# Patient Record
Sex: Female | Born: 1955 | Race: Black or African American | Hispanic: No | Marital: Single | Smoking: Never smoker
Health system: Southern US, Community
[De-identification: ages and names within clinical notes are randomized; demographics above are authoritative.]

## PROBLEM LIST (undated history)

## (undated) DIAGNOSIS — E079 Disorder of thyroid, unspecified: Secondary | ICD-10-CM

---

## 2014-12-24 DIAGNOSIS — E039 Hypothyroidism, unspecified: Secondary | ICD-10-CM | POA: Insufficient documentation

## 2015-11-15 DIAGNOSIS — Z832 Family history of diseases of the blood and blood-forming organs and certain disorders involving the immune mechanism: Secondary | ICD-10-CM | POA: Insufficient documentation

## 2016-10-16 DIAGNOSIS — M79644 Pain in right finger(s): Secondary | ICD-10-CM | POA: Insufficient documentation

## 2017-06-04 DIAGNOSIS — M1711 Unilateral primary osteoarthritis, right knee: Secondary | ICD-10-CM | POA: Insufficient documentation

## 2018-06-19 ENCOUNTER — Other Ambulatory Visit: Payer: Self-pay | Admitting: Family Medicine

## 2018-06-19 DIAGNOSIS — E0590 Thyrotoxicosis, unspecified without thyrotoxic crisis or storm: Secondary | ICD-10-CM

## 2018-06-19 DIAGNOSIS — E0789 Other specified disorders of thyroid: Secondary | ICD-10-CM

## 2018-06-24 ENCOUNTER — Ambulatory Visit
Admission: RE | Admit: 2018-06-24 | Discharge: 2018-06-24 | Disposition: A | Discharge status: Home / Self Care | Payer: BC Managed Care – PPO | Source: Ambulatory Visit | Attending: Family Medicine | Admitting: Family Medicine

## 2018-06-24 DIAGNOSIS — E0590 Thyrotoxicosis, unspecified without thyrotoxic crisis or storm: Secondary | ICD-10-CM | POA: Diagnosis not present

## 2018-06-24 DIAGNOSIS — E042 Nontoxic multinodular goiter: Secondary | ICD-10-CM | POA: Diagnosis not present

## 2018-06-24 DIAGNOSIS — E0789 Other specified disorders of thyroid: Secondary | ICD-10-CM | POA: Diagnosis not present

## 2019-04-01 ENCOUNTER — Other Ambulatory Visit: Payer: Self-pay | Admitting: Otolaryngology

## 2019-04-01 DIAGNOSIS — E041 Nontoxic single thyroid nodule: Secondary | ICD-10-CM

## 2019-04-03 ENCOUNTER — Other Ambulatory Visit: Payer: Self-pay | Admitting: Unknown Physician Specialty

## 2019-04-03 DIAGNOSIS — R1310 Dysphagia, unspecified: Secondary | ICD-10-CM

## 2019-04-11 ENCOUNTER — Ambulatory Visit
Admission: RE | Admit: 2019-04-11 | Discharge: 2019-04-11 | Disposition: A | Discharge status: Home / Self Care | Payer: BC Managed Care – PPO | Source: Ambulatory Visit | Attending: Otolaryngology | Admitting: Otolaryngology

## 2019-04-11 ENCOUNTER — Other Ambulatory Visit: Payer: Self-pay

## 2019-04-11 DIAGNOSIS — E041 Nontoxic single thyroid nodule: Secondary | ICD-10-CM

## 2019-04-18 ENCOUNTER — Other Ambulatory Visit: Payer: Self-pay | Admitting: Obstetrics & Gynecology

## 2019-04-18 DIAGNOSIS — Z1231 Encounter for screening mammogram for malignant neoplasm of breast: Secondary | ICD-10-CM

## 2019-05-07 ENCOUNTER — Ambulatory Visit
Admission: RE | Admit: 2019-05-07 | Discharge: 2019-05-07 | Disposition: A | Discharge status: Home / Self Care | Payer: BC Managed Care – PPO | Source: Ambulatory Visit | Attending: Obstetrics & Gynecology | Admitting: Obstetrics & Gynecology

## 2019-05-07 ENCOUNTER — Encounter: Payer: Self-pay | Admitting: Radiology

## 2019-05-07 DIAGNOSIS — Z1231 Encounter for screening mammogram for malignant neoplasm of breast: Secondary | ICD-10-CM | POA: Insufficient documentation

## 2019-05-08 ENCOUNTER — Ambulatory Visit: Payer: BC Managed Care – PPO

## 2019-05-31 ENCOUNTER — Ambulatory Visit
Admission: EM | Admit: 2019-05-31 | Discharge: 2019-05-31 | Disposition: A | Discharge status: Home / Self Care | Payer: BC Managed Care – PPO | Attending: Emergency Medicine | Admitting: Emergency Medicine

## 2019-05-31 ENCOUNTER — Encounter: Payer: Self-pay | Admitting: Emergency Medicine

## 2019-05-31 ENCOUNTER — Other Ambulatory Visit: Payer: Self-pay

## 2019-05-31 ENCOUNTER — Ambulatory Visit (INDEPENDENT_AMBULATORY_CARE_PROVIDER_SITE_OTHER): Payer: BC Managed Care – PPO

## 2019-05-31 DIAGNOSIS — M79674 Pain in right toe(s): Secondary | ICD-10-CM

## 2019-05-31 DIAGNOSIS — S92491A Other fracture of right great toe, initial encounter for closed fracture: Secondary | ICD-10-CM | POA: Diagnosis not present

## 2019-05-31 DIAGNOSIS — W01198A Fall on same level from slipping, tripping and stumbling with subsequent striking against other object, initial encounter: Secondary | ICD-10-CM | POA: Diagnosis not present

## 2019-05-31 HISTORY — DX: Disorder of thyroid, unspecified: E07.9

## 2019-05-31 NOTE — ED Provider Notes (Signed)
[facility] ____________________________________________  Time seen: Approximately 3:22 PM  I have reviewed the triage vital signs and the nursing notes.   HISTORY  Chief Complaint Toe Pain (right big toe)   HPI Carrie Tran is a 63 y.o. female presenting for evaluation of right great toe pain.  Patient reports 1 week ago she was walking through her house and tripped over her rug causing her to hit her toe.  Denies fall or other injury.  States she was doing much better after a few days of buddy taping her toe, but reports a few nights ago she rolled over in her sleep and slept on her stomach with her toe against the bed causing it to become aggravated again.  States pain is mild.  States buddy taping has helped.  Denies pain radiation, paresthesias or other injuries.  Reports otherwise doing well.  Past Medical History:  Diagnosis Date   Thyroid disease     There are no active problems to display for this patient.   History reviewed. No pertinent surgical history.   No current facility-administered medications for this encounter.   Current Outpatient Medications:    calcium carbonate (OS-CAL) 600 MG TABS tablet, Take by mouth., Disp: , Rfl:    Cholecalciferol (VITAMIN D3) 125 MCG (5000 UT) TABS, Take by mouth., Disp: , Rfl:    levothyroxine (SYNTHROID) 50 MCG tablet, Take by mouth., Disp: , Rfl:    Multiple Vitamin (MULTI-VITAMIN) tablet, Take by mouth., Disp: , Rfl:   Allergies Banana, Oxycodone-acetaminophen, and Latex  Family History  Problem Relation Age of Onset   Diabetes Mother     Social History Social History   Tobacco Use   Smoking status: Never Smoker   Smokeless tobacco: Never Used  Substance Use Topics   Alcohol use: Not Currently   Drug use: Never    Review of Systems Constitutional: No fever ENT: No sore throat. Cardiovascular: Denies chest pain. Respiratory: Denies shortness of breath. Musculoskeletal: Right great  toe pain.  ____________________________________________   PHYSICAL EXAM:  VITAL SIGNS: ED Triage Vitals  Enc Vitals Group     BP 05/31/19 1456 124/67     Pulse Rate 05/31/19 1456 69     Resp 05/31/19 1456 16     Temp 05/31/19 1456 97.8 F (36.6 C)     Temp Source 05/31/19 1456 Oral     SpO2 05/31/19 1456 100 %     Weight 05/31/19 1452 169 lb (76.7 kg)     Height 05/31/19 1452 5\' 4"  (1.626 m)     Head Circumference --      Peak Flow --      Pain Score 05/31/19 1451 2     Pain Loc --      Pain Edu? --      Excl. in GC? --     Constitutional: Alert and oriented. Well appearing and in no acute distress.06/02/19 ENT      Head: Normocephalic and atraumatic. Cardiovascular: Good peripheral circulation. Respiratory: Normal respiratory effort without tachypnea nor retractions. Musculoskeletal:  Bilateral pedal pulses equal and easily palpated.  Steady gait. Except: Right great toe proximal distal phalanx and proximal phalanx tenderness to direct palpation with minimal swelling and ecchymosis, able to still flex and extend, normal distal sensation, right foot otherwise nontender. Neurologic:  Normal speech and language.  Skin:  Skin is warm, dry and intact. No rash noted. Psychiatric: Mood and affect are normal. Speech and behavior are normal. Patient exhibits appropriate insight and judgment  ___________________________________________   LABS (all labs ordered are listed, but only abnormal results are displayed)  Labs Reviewed - No data to display  RADIOLOGY  Dg Toe Great Right  Result Date: 05/31/2019 CLINICAL DATA:  Pain after fall EXAM: RIGHT FIRST TOE: 3 V COMPARISON:  None. FINDINGS: Frontal, oblique, and lateral views were obtained. There is a fracture along the dorsal aspect of the proximal portion of the first distal phalanx. Fracture fragment is present in the dorsal aspect of the first IP joint. No other fracture is evident. No dislocation. There is no appreciable joint  space narrowing or erosion. IMPRESSION: Fracture along the dorsal aspect of the proximal portion of the first distal phalanx. No other fracture. No dislocation. No appreciable arthropathy. These results will be called to the ordering clinician or representative by the Radiologist Assistant, and communication documented in the PACS or zVision Dashboard. Electronically Signed   By: Lowella Grip III M.D.   On: 05/31/2019 15:15   ____________________________________________   PROCEDURES Procedures    INITIAL IMPRESSION / ASSESSMENT AND PLAN / ED COURSE  Pertinent labs & imaging results that were available during my care of the patient were reviewed by me and considered in my medical decision making (see chart for details).  Well-appearing patient.  No acute distress.  Right great toe pain post mechanical injury.  Right great toe x-ray as above, fracture along dorsal aspect of the proximal portion distal phalanx.  Buddy taping to first and second toe applied and postoperative shoe sent.  Continue supportive care, follow-up with podiatry.  Discussed follow up and return parameters including no resolution or any worsening concerns. Patient verbalized understanding and agreed to plan.   ____________________________________________   FINAL CLINICAL IMPRESSION(S) / ED DIAGNOSES  Final diagnoses:  Other fracture of right great toe, initial encounter for closed fracture     ED Discharge Orders    None       Note: This dictation was prepared with Dragon dictation along with smaller phrase technology. Any transcriptional errors that result from this process are unintentional.         Marylene Land, NP 05/31/19 1754

## 2019-05-31 NOTE — ED Triage Notes (Signed)
Patient states that a week ago that she tripped over a rock and injured her right big toe.  Patient c/o ongoing pain in her right big toe.

## 2019-05-31 NOTE — Discharge Instructions (Addendum)
Ice. Buddy tape. Use post op shoe.   Follow-up with podiatry in 1 week.  Follow up with your primary care physician this week as needed. Return to Urgent care for new or worsening concerns.

## 2019-06-03 ENCOUNTER — Other Ambulatory Visit: Payer: Self-pay

## 2019-06-03 ENCOUNTER — Ambulatory Visit
Admission: RE | Admit: 2019-06-03 | Discharge: 2019-06-03 | Disposition: A | Discharge status: Home / Self Care | Payer: BC Managed Care – PPO | Source: Ambulatory Visit | Attending: Unknown Physician Specialty | Admitting: Unknown Physician Specialty

## 2019-06-03 DIAGNOSIS — R1310 Dysphagia, unspecified: Secondary | ICD-10-CM | POA: Insufficient documentation

## 2019-06-03 NOTE — Therapy (Signed)
[facility] DI[facility]NOSTIC RADIOLOGY [address] [place], [place], [address] Phone: [phone]   Fax:     Modified Barium Swallow  Patient Details  Name: Carrie Tran MRN: [id number] Date of Birth: 22-May-1956 No data recorded  Encounter Date: 06/03/2019  End of Session - 06/03/19 1411    Visit Number  1    Number of Visits  1    Date for SLP Re-Evaluation  06/03/19    SLP Start Time  1300    SLP Stop Time   1345    SLP Time Calculation (min)  45 min    Activity Tolerance  Patient tolerated treatment well       Past Medical History:  Diagnosis Date  . Thyroid disease     No past surgical history on file.  There were no vitals filed for this visit.    Subjective: Patient behavior: (alertness, ability to follow instructions, etc.): The patient is alert, able to verbalize her swallowing complaints, and follow directions.  Chief complaint: Patient reports food and pills sticking in her throat   Objective:  Radiological Procedure: A videoflouroscopic evaluation of oral-preparatory, reflex initiation, and pharyngeal phases of the swallow was performed; as well as a screening of the upper esophageal phase.  I. POSTURE: Upright in MBS chair, standing  II. VIEW: Lateral and A-P  III. COMPENSATORY STRATEGIES: Liquid wash aids esophageal clearance  IV. BOLUSES ADMINISTERED:   Thin Liquid: 3   Nectar-thick Liquid: 2   Honey-thick Liquid: DNT   Puree: 3   Mechanical Soft: 1/4 graham cracker in applesauce   Barium tablet  V. RESULTS OF EVALUATION: A. ORAL PREPARATORY PHASE: (The lips, tongue, and velum are observed for strength and coordination)       **Overall Severity Rating: within normal limits  B. SWALLOW INITIATION/REFLEX: (The reflex is normal if "triggered" by the time the bolus reached the base of the tongue)  **Overall Severity Rating: within normal limits   C. PHARYNGEAL PHASE: (Pharyngeal function is normal if  the bolus shows rapid, smooth, and continuous transit through the pharynx and there is no pharyngeal residue after the swallow)  **Overall Severity Rating: within normal limits   D. LARYNGEAL PENETRATION: (Material entering into the laryngeal inlet/vestibule but not aspirated) None  E. ASPIRATION: None  F. ESOPHAGEAL PHASE: (Screening of the upper esophagus) barium tablet was observed to be lodged at the mid-esophagus.  A bolus of nectar-thick liquid moved the pill on through to the stomach.  There was mid- and distal-esophagus stasis with solid bolus.    ASSESSMENT: This 63 year old woman; with complaint of pills/food sticking in her throat and choking episodes; is presenting with functional oropharyngeal swallowing.  Oral control of the bolus including oral hold, rotary mastication, and anterior to posterior transfer is within functional limits.   Timing of pharyngeal swallow initiation is within normal limits.  Aspects of the pharyngeal stage of swallowing including tongue base retraction, hyolaryngeal excursion, epiglottic inversion, and duration/amplitude of UES opening are within normal limits.  There is no observed pharyngeal residue, laryngeal penetration, or tracheal aspiration.  The patient is not at risk for prandial aspiration.  The patient's symptoms were reproduced with a barium tablet.  Although it flowed quickly through the oropharynx and into the esophagus, the patient reported the sensation of the pill in her throat.  On standing for the A-P view, the barium tablet was observed to be lodged at the mid-esophagus.  A bolus of nectar-thick liquid moved the pill on  through to the stomach.  There was mid- and distal-esophagus stasis with solid bolus.  The patient observed the tablet flow into the esophagus despite her sensation of it sticking in her throat.  She was reassured that when she feels choked, the food/pill is most probably lodged lower in the esophagus.  She was advised to alternate  liquids and solids when eating and to take extra water with medication to promote esophageal clearance.  PLAN/RECOMMENDATIONS:   A. Diet: Regular   B. Swallowing Precautions: Alternate liquids and solids, take plenty of fluids with medication   C. Recommended consultation to: follow up with MDs as recommended, consider GI consult   D. Therapy recommendations: speech therapy is not indicated   E. Results and recommendations were discussed with the patient immediately following the study and the final report routed to the referring MD.   Dysphagia, unspecified type - Plan: DG SWALLOW FUNC SPEECH PATH, DG SWALLOW FUNC SPEECH PATH        Problem List There are no active problems to display for this patient.  Leroy Sea, MS/CCC- SLP  Lou Miner 06/03/2019, Mickie Kay PM  [facility] [place], [place], [address] Phone: [phone]   Fax:     Name: Carrie Tran MRN: [id number] Date of Birth: 1955/10/31

## 2019-07-21 ENCOUNTER — Other Ambulatory Visit: Payer: Self-pay

## 2019-07-21 ENCOUNTER — Ambulatory Visit (INDEPENDENT_AMBULATORY_CARE_PROVIDER_SITE_OTHER): Payer: BC Managed Care – PPO | Admitting: Gastroenterology

## 2019-07-21 ENCOUNTER — Encounter: Payer: Self-pay | Admitting: Gastroenterology

## 2019-07-21 VITALS — BP 117/74 | HR 70 | Temp 96.1°F | Ht 64.0 in | Wt 171.6 lb

## 2019-07-21 DIAGNOSIS — R1319 Other dysphagia: Secondary | ICD-10-CM

## 2019-07-21 DIAGNOSIS — R1310 Dysphagia, unspecified: Secondary | ICD-10-CM

## 2019-07-21 NOTE — Progress Notes (Signed)
Gastroenterology Consultation  Referring Provider:     Carloyn Manner, MD Primary Care Physician:  Patient, No Pcp Per Primary Gastroenterologist:  Dr. Allen Norris     Reason for Consultation:     Dysphagia        HPI:   Carrie Tran is a 64 y.o. y/o female referred for consultation & management of dysphagia by Dr. Patient, No Pcp Per.  This patient comes today with a history of dysphagia since she was a young child.  She states it can happen with any types of food.  The patient was seen by ENT and ordered a modified barium swallow.  The patient then reports that she felt like pills she had taken was stuck in her esophagus.  She then had fluoroscopy taken of her chest that showed the pill to be stuck there which promptly cleared after taking it on the drink.  The patient's overall recommendation was a GI consult after there was a mid to distal esophageal stasis with solid bolus.  The patient denies any black stools or bloody stools.  She also denies any fevers chills nausea vomiting or change in bowel habits.  The patient reports that her last colonoscopy was 10 years ago.  Past Medical History:  Diagnosis Date  . Thyroid disease     History reviewed. No pertinent surgical history.  Prior to Admission medications   Medication Sig Start Date End Date Taking? Authorizing Provider  calcium carbonate (OS-CAL) 600 MG TABS tablet Take by mouth.   Yes [provider]  Cholecalciferol (VITAMIN D3) 125 MCG (5000 UT) TABS Take by mouth.   Yes [provider]  levothyroxine (SYNTHROID) 50 MCG tablet Take by mouth. 02/08/18  Yes [provider]  Multiple Vitamin (MULTI-VITAMIN) tablet Take by mouth.   Yes [provider]    Family History  Problem Relation Age of Onset  . Diabetes Mother      Social History   Tobacco Use  . Smoking status: Never Smoker  . Smokeless tobacco: Never Used  Substance Use Topics  . Alcohol use: Not Currently  . Drug use:  Never    Allergies as of 07/21/2019 - Review Complete 07/21/2019  Allergen Reaction Noted  . Banana Other (See Comments) and Swelling 10/25/2013  . Oxycodone-acetaminophen Other (See Comments) 05/19/2011  . Latex Rash 05/19/2011    Review of Systems:    All systems reviewed and negative except where noted in HPI.   Physical Exam:  BP 117/74   Pulse 70   Temp (!) 96.1 F (35.6 C) (Temporal)   Ht 5\' 4"  (1.626 m)   Wt 171 lb 9.6 oz (77.8 kg)   BMI 29.46 kg/m  No LMP recorded. Patient is postmenopausal. General:   Alert,  Well-developed, well-nourished, pleasant and cooperative in NAD Head:  Normocephalic and atraumatic. Eyes:  Sclera clear, no icterus.   Conjunctiva pink. Ears:  Normal auditory acuity. Neck:  Supple; no masses or thyromegaly. Lungs:  Respirations even and unlabored.  Clear throughout to auscultation.   No wheezes, crackles, or rhonchi. No acute distress. Heart:  Regular rate and rhythm; no murmurs, clicks, rubs, or gallops. Abdomen:  Normal bowel sounds.  No bruits.  Soft, non-tender and non-distended without masses, hepatosplenomegaly or hernias noted.  No guarding or rebound tenderness.  Negative Carnett sign.   Rectal:  Deferred.  Pulses:  Normal pulses noted. Extremities:  No clubbing or edema.  No cyanosis. Neurologic:  Alert and oriented x3;  grossly normal neurologically.  Skin:  Intact without significant lesions or rashes.  No jaundice. Lymph Nodes:  No significant cervical adenopathy. Psych:  Alert and cooperative. Normal mood and affect.  Imaging Studies: No results found.  Assessment and Plan:   Carrie Tran is a 64 y.o. y/o female who comes in today with a history of longstanding dysphagia since she was a child.  She states it can happen with any food and is not always with solids.  The patient had a modified barium swallow with a barium pill that appears to have hung up in the mid to distal esophagus.  The patient will be set up for an EGD for  her dysphagia and a colonoscopy because she is due for her screening.  The patient has been explained the plan and agrees with it.    [name], MD. Clementeen Graham    Note: This dictation was prepared with Dragon dictation along with smaller phrase technology. Any transcriptional errors that result from this process are unintentional.

## 2020-10-21 ENCOUNTER — Other Ambulatory Visit: Payer: Self-pay

## 2020-10-21 ENCOUNTER — Ambulatory Visit
Admission: EM | Admit: 2020-10-21 | Discharge: 2020-10-21 | Disposition: A | Discharge status: Home / Self Care | Payer: BC Managed Care – PPO | Attending: Physician Assistant | Admitting: Physician Assistant

## 2020-10-21 ENCOUNTER — Encounter: Payer: Self-pay | Admitting: Emergency Medicine

## 2020-10-21 DIAGNOSIS — R300 Dysuria: Secondary | ICD-10-CM | POA: Diagnosis not present

## 2020-10-21 LAB — URINALYSIS, COMPLETE (UACMP) WITH MICROSCOPIC
Bilirubin Urine: NEGATIVE
Glucose, UA: NEGATIVE mg/dL
Hgb urine dipstick: NEGATIVE
Ketones, ur: NEGATIVE mg/dL
Leukocytes,Ua: NEGATIVE
Nitrite: NEGATIVE
Protein, ur: NEGATIVE mg/dL
Specific Gravity, Urine: <1.005 — ABNORMAL LOW (ref 1.005–1.030)
pH: 5.5 (ref 5.0–8.0)

## 2020-10-21 MED ORDER — NITROFURANTOIN MONOHYD MACRO 100 MG PO CAPS: 100.0000 mg | ORAL_CAPSULE | Freq: Two times a day (BID) | ORAL | 0 refills | Status: AC

## 2020-10-21 NOTE — ED Provider Notes (Signed)
[facility]    CSN: [id number] Arrival date & time: 10/21/20  1742      History   Chief Complaint Chief Complaint  Patient presents with  . Dysuria    HPI Carrie Tran is a 65 y.o. female who presents with onset of dysuria onset 3 days ago. She drinks cranberry juice all the time and stopped 2 weeks ago. Monday she had suprapubic pain and dysuria, so started drinking it again and symptoms resolved, then stopped it and today dysuria came back.  She has never had a UTI. Denies having a sexual partner    Past Medical History:  Diagnosis Date  . Thyroid disease     Patient Active Problem List   Diagnosis Date Noted  . Osteoarthritis of right knee 06/04/2017  . Pain of finger of right hand 10/16/2016  . Family history of thalassemia 11/15/2015  . Acquired hypothyroidism 12/24/2014  . Other abnormality of red blood cells 04/03/2012    History reviewed. No pertinent surgical history.  OB History   No obstetric history on file.      Home Medications    Prior to Admission medications   Medication Sig Start Date End Date Taking? Authorizing Provider  calcium carbonate (OS-CAL) 600 MG TABS tablet Take by mouth.   Yes [provider]  Cholecalciferol (VITAMIN D3) 125 MCG (5000 UT) TABS Take by mouth.   Yes [provider]  levothyroxine (SYNTHROID) 50 MCG tablet Take by mouth. 02/08/18  Yes [provider]  Multiple Vitamin (MULTI-VITAMIN) tablet Take by mouth.   Yes [provider]  nitrofurantoin, macrocrystal-monohydrate, (MACROBID) 100 MG capsule Take 1 capsule (100 mg total) by mouth 2 (two) times daily. 10/21/20  Yes Rodriguez-Southworth, Nettie Elm, PA-C    Family History Family History  Problem Relation Age of Onset  . Diabetes Mother     Social History Social History   Tobacco Use  . Smoking status: Never Smoker  . Smokeless tobacco: Never Used  Vaping Use  . Vaping Use: Never used  Substance Use Topics  .  Alcohol use: Not Currently  . Drug use: Never     Allergies   Banana, Oxycodone-acetaminophen, and Latex   Review of Systems Review of Systems  Constitutional: Negative for chills, diaphoresis and fever.  Genitourinary: Positive for dysuria, frequency and urgency. Negative for flank pain, pelvic pain, vaginal bleeding and vaginal discharge.  Musculoskeletal: Negative for gait problem and myalgias.  Skin: Negative for rash.     Physical Exam Triage Vital Signs ED Triage Vitals  Enc Vitals Group     BP 10/21/20 1808 123/79     Pulse Rate 10/21/20 1806 72     Resp 10/21/20 1806 18     Temp 10/21/20 1806 98.2 F (36.8 C)     Temp Source 10/21/20 1806 Oral     SpO2 10/21/20 1806 100 %     Weight 10/21/20 1804 171 lb 8.3 oz (77.8 kg)     Height 10/21/20 1804 5\' 4"  (1.626 m)     Head Circumference --      Peak Flow --      Pain Score 10/21/20 1804 0     Pain Loc --      Pain Edu? --      Excl. in GC? --    No data found.  Updated Vital Signs BP 123/79   Pulse 72   Temp 98.2 F (36.8 C) (Oral)   Resp 18   Ht 5\' 4"  (1.626  m)   Wt 171 lb 8.3 oz (77.8 kg)   SpO2 100%   BMI 29.44 kg/m   Visual Acuity Right Eye Distance:   Left Eye Distance:   Bilateral Distance:    Right Eye Near:   Left Eye Near:    Bilateral Near:     Physical Exam Physical Exam Vitals and nursing note reviewed.  Constitutional:      General: She is not in acute distress.    Appearance: She is not toxic-appearing.  HENT:     Head: Normocephalic.     Right Ear: External ear normal.     Left Ear: External ear normal.  Eyes:     General: No scleral icterus.    Conjunctiva/sclera: Conjunctivae normal.  Pulmonary:     Effort: Pulmonary effort is normal.  Abdominal:     General: Bowel sounds are normal.     Palpations: Abdomen is soft. There is no mass.     Tenderness: There is no guarding or rebound.     Comments: - CVA tenderness   Musculoskeletal:        General: Normal range of  motion.     Cervical back: Neck supple.    Skin:    General: Skin is warm and dry.     Findings: No rash.  Neurological:     Mental Status: She is alert and oriented to person, place, and time.     Gait: Gait normal.  Psychiatric:        Mood and Affect: Mood normal.        Behavior: Behavior normal.        Thought Content: Thought content normal.        Judgment: Judgment normal.     UC Treatments / Results  Labs (all labs ordered are listed, but only abnormal results are displayed) Labs Reviewed  URINALYSIS, COMPLETE (UACMP) WITH MICROSCOPIC - Abnormal; Notable for the following components:      Result Value   Specific Gravity, Urine <1.005 (*)    Bacteria, UA RARE (*)    All other components within normal limits  URINE CULTURE    EKG   Radiology No results found.  Procedures Procedures (including critical care time)  Medications Ordered in UC Medications - No data to display  Initial Impression / Assessment and Plan / UC Course  I have reviewed the triage vital signs and the nursing notes. Pertinent labs  results that were available during my care of the patient were reviewed by me and considered in my medical decision making (see chart for details). Urine sent out for a culture. In the mean time I placed her on Macrobid as noted. If culture is negative needs to see GYN.   Final Clinical Impressions(s) / UC Diagnoses   Final diagnoses:  Dysuria     Discharge Instructions     I sent the urine to get a culture to confirm if there is a true infection and we will call you if there is any changes that need to be done.     ED Prescriptions    Medication Sig Dispense Auth. Provider   nitrofurantoin, macrocrystal-monohydrate, (MACROBID) 100 MG capsule Take 1 capsule (100 mg total) by mouth 2 (two) times daily. 10 capsule Rodriguez-Southworth, Nettie Elm, PA-C     PDMP not reviewed this encounter.   Garey Ham, Cordelia Poche 10/21/20 1840

## 2020-10-21 NOTE — ED Triage Notes (Signed)
Patient c/o dysuria that started 2-3 days ago.

## 2020-10-21 NOTE — Discharge Instructions (Signed)
I sent the urine to get a culture to confirm if there is a true infection and we will call you if there is any changes that need to be done.

## 2020-10-23 LAB — URINE CULTURE: Culture: NO GROWTH

## 2021-02-01 IMAGING — MG DIGITAL SCREENING BILAT W/ TOMO W/ CAD
6 series · 6 of 18 positions shown · non-contrast
Comparison: None.

CLINICAL DATA: Screening.

EXAM:
DIGITAL SCREENING BILATERAL MAMMOGRAM WITH TOMO AND CAD

[L CC synth-2D]
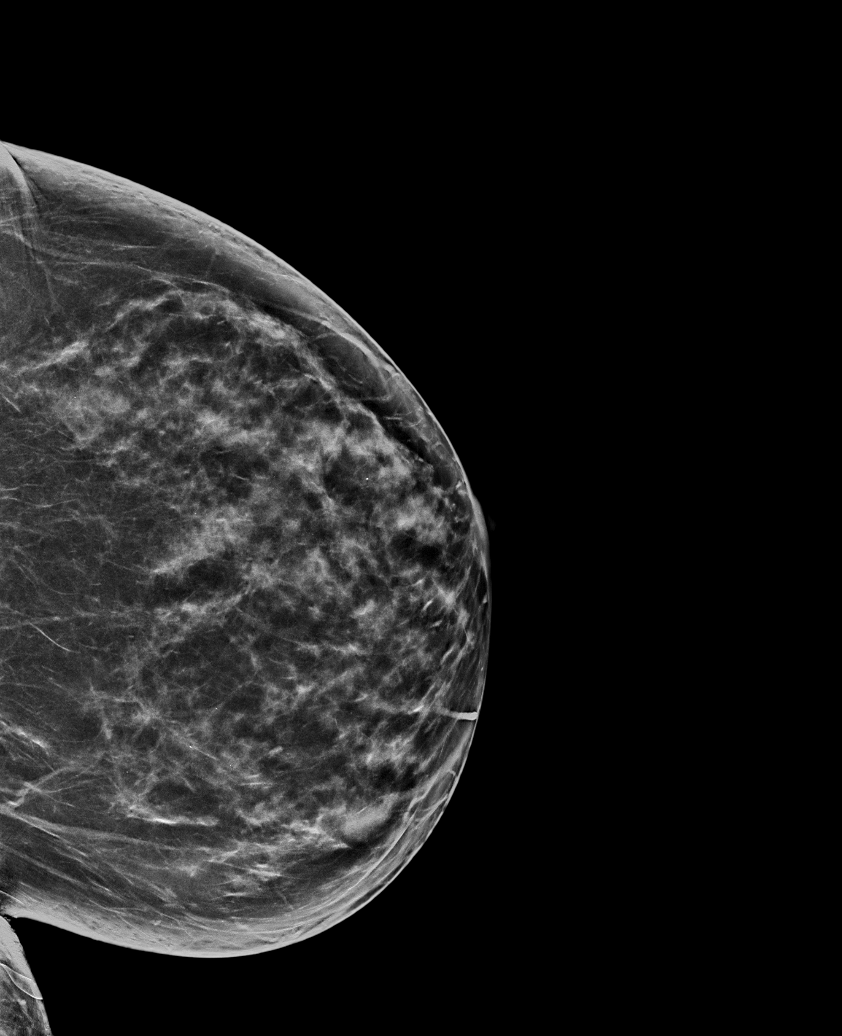

[R MLO synth-2D]
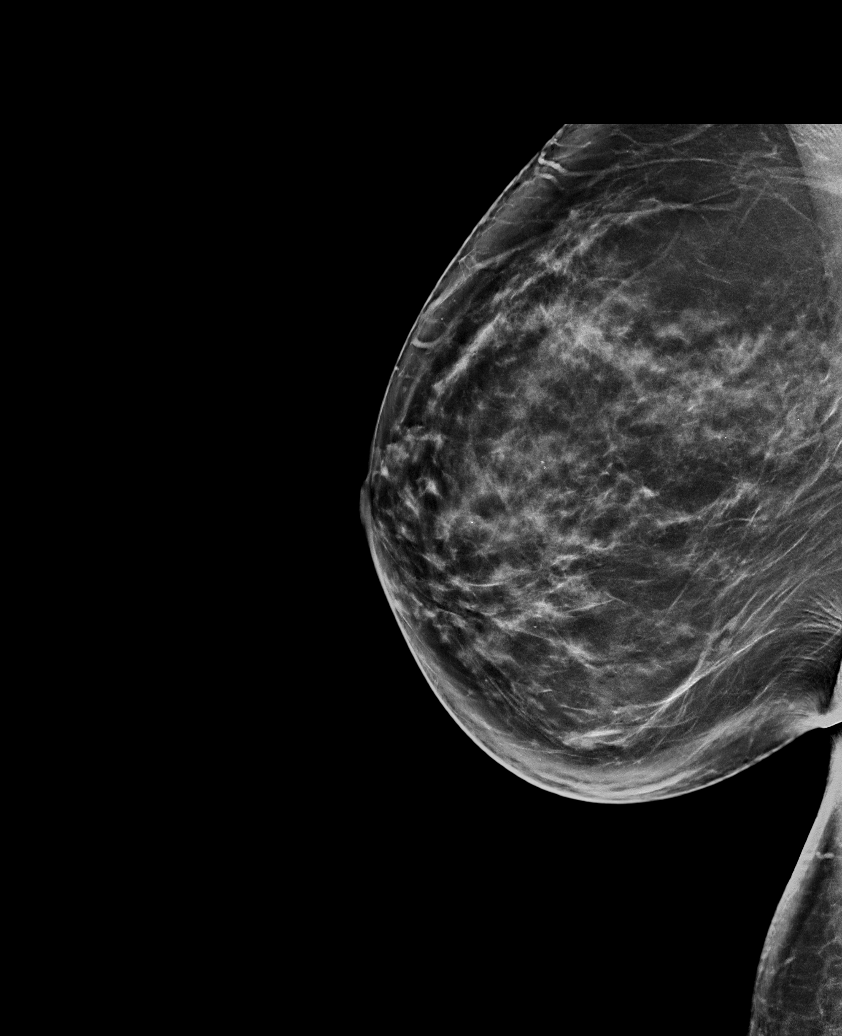

[R CC synth-2D]
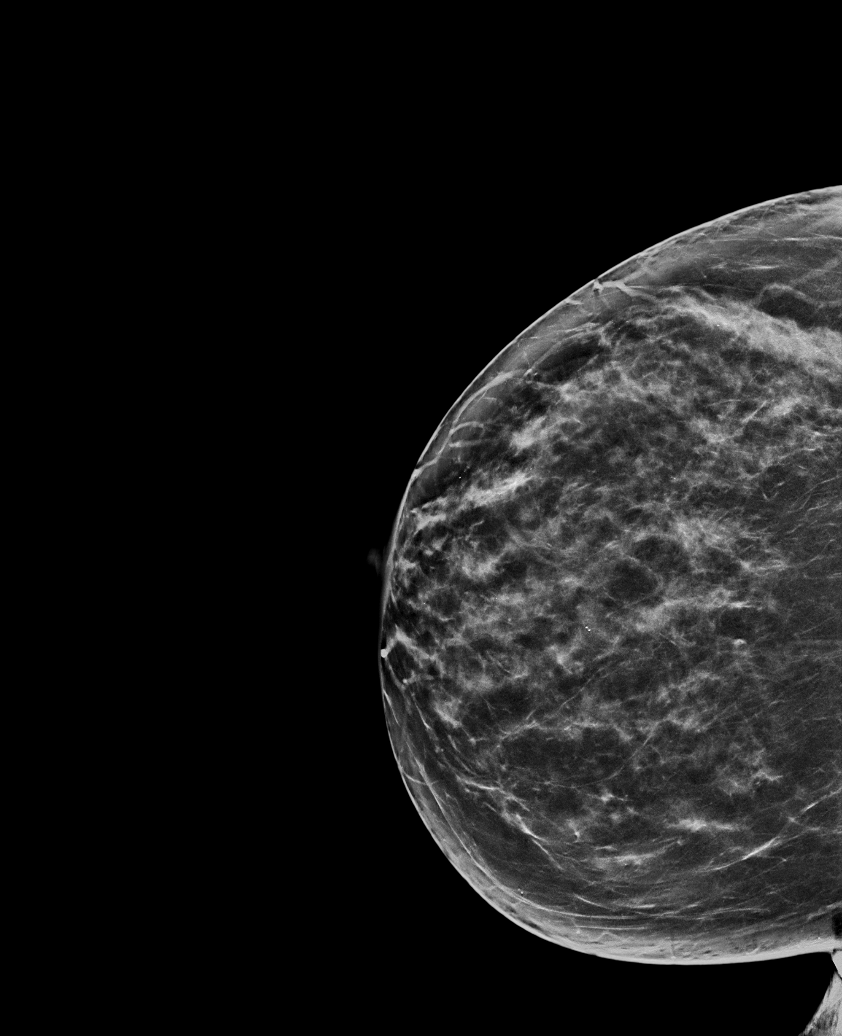

[R MLO tomo · tomo slice 46/91.0]
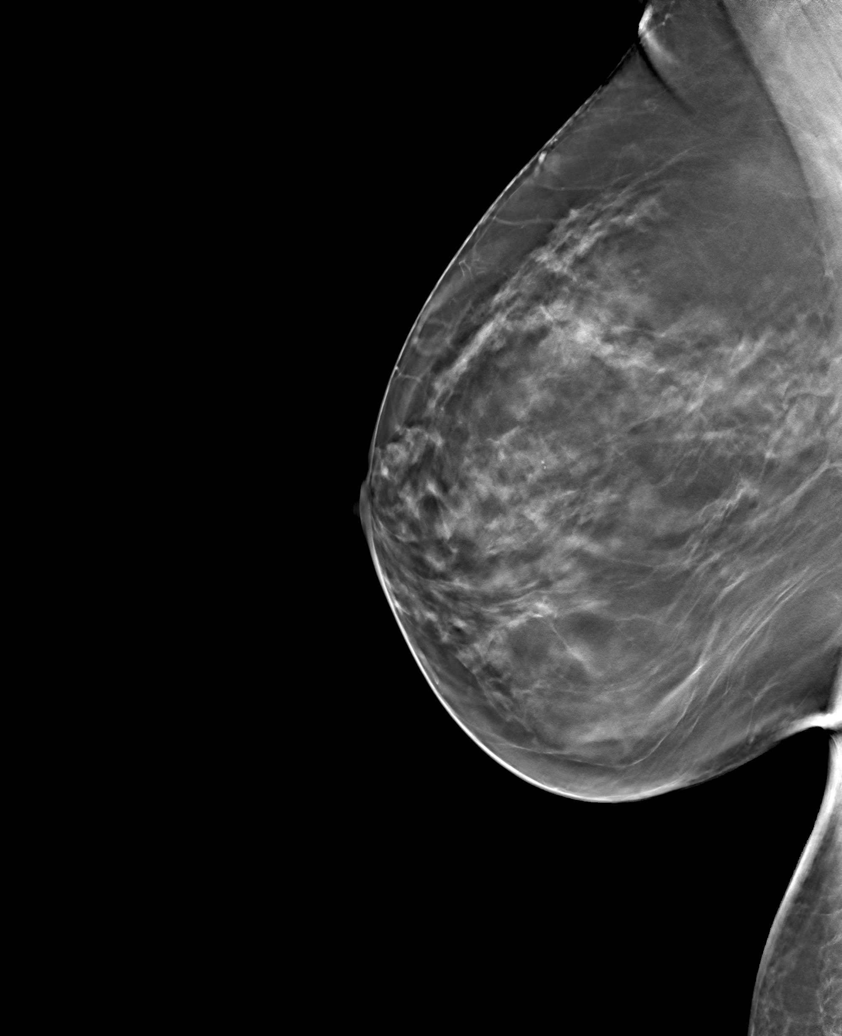

[L CC tomo · tomo slice 43/84.0]
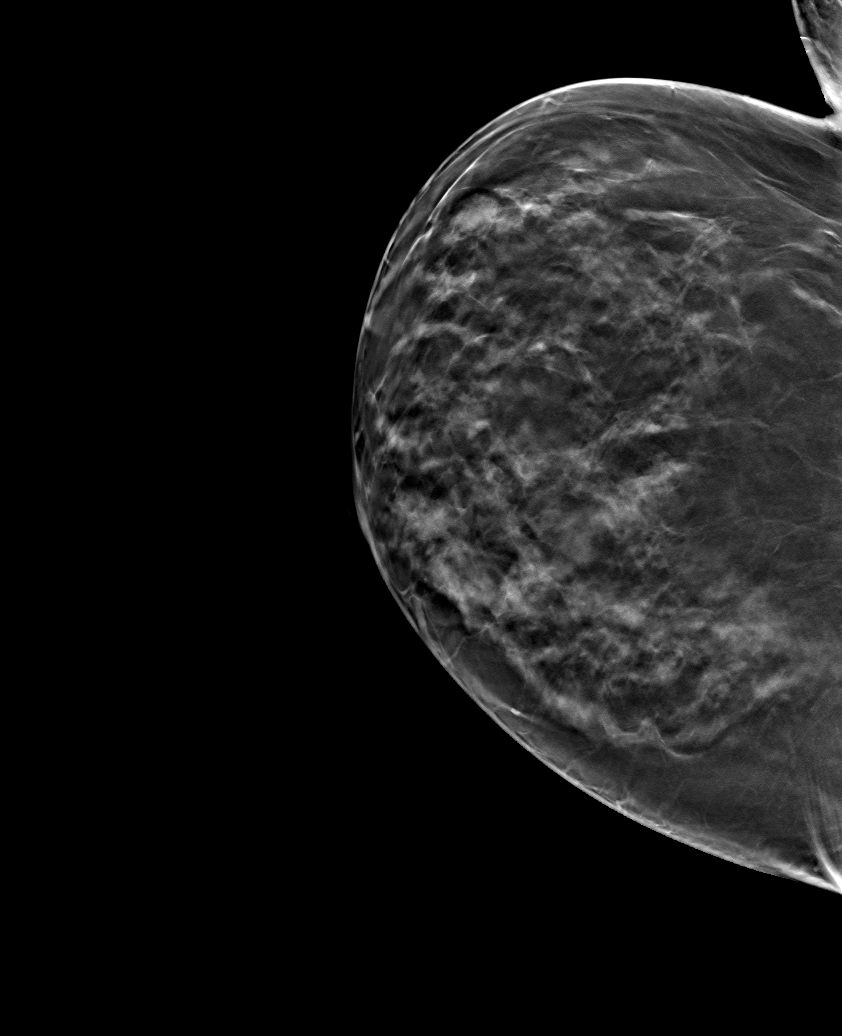

[R CC tomo · tomo slice 41/82.0]
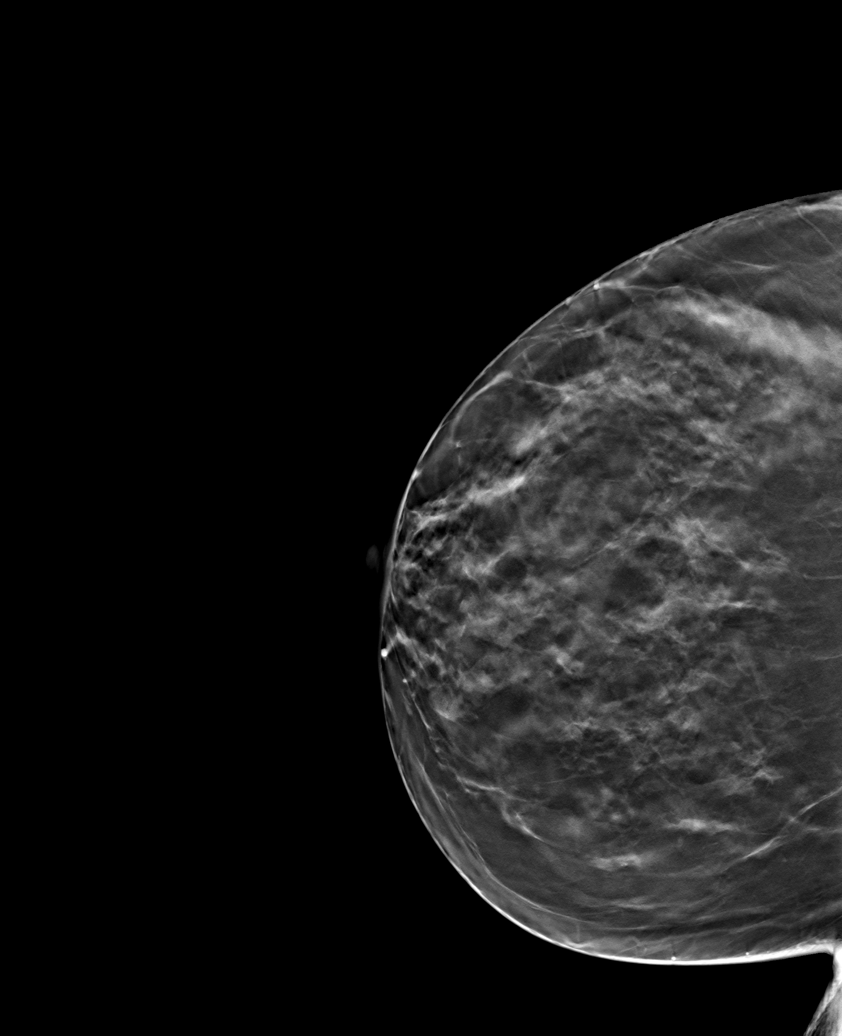

[6 of 18 positions shown; findings below may reference images not displayed]

ACR Breast Density Category c: The breast tissue is heterogeneously
dense, which may obscure small masses
FINDINGS: There are no findings suspicious for malignancy. Images were
processed with CAD.
IMPRESSION: No mammographic evidence of malignancy. A result letter of this
screening mammogram will be mailed directly to the patient.

RECOMMENDATION:
Screening mammogram in one year. (Code:EM-2-IHY)

BI-RADS CATEGORY  1: Negative.

## 2021-02-25 IMAGING — CR DG TOE GREAT 2+V*R*
3 series · 3 of 3 positions shown · non-contrast
Comparison: None.

CLINICAL DATA: Pain after fall

EXAM:
RIGHT FIRST TOE: 3 V

[toe ap]
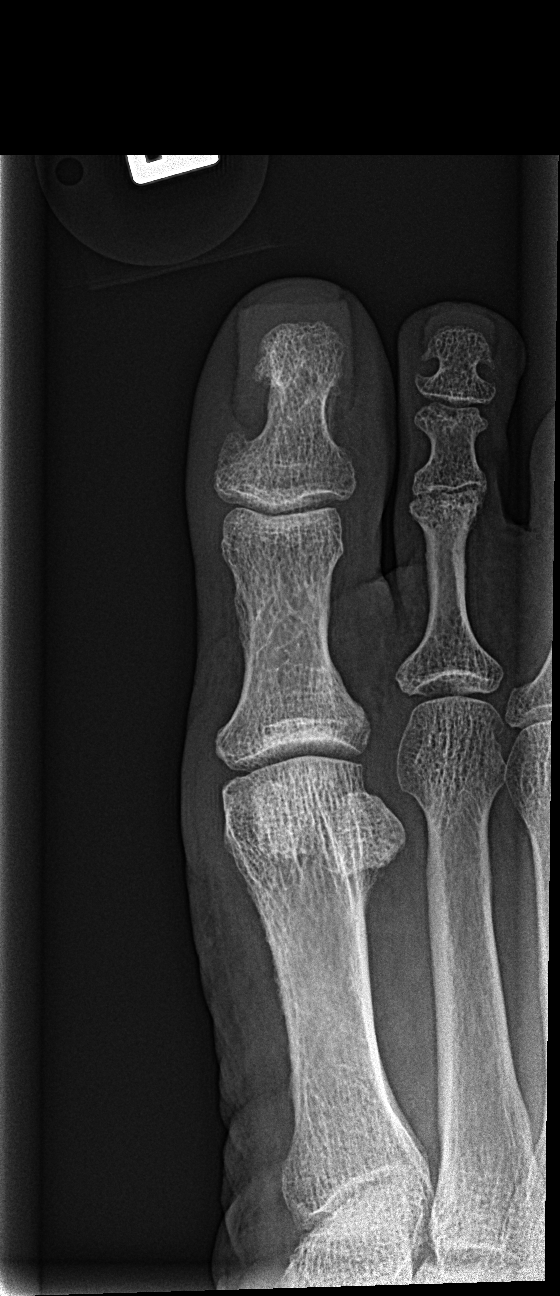

[toe obl]
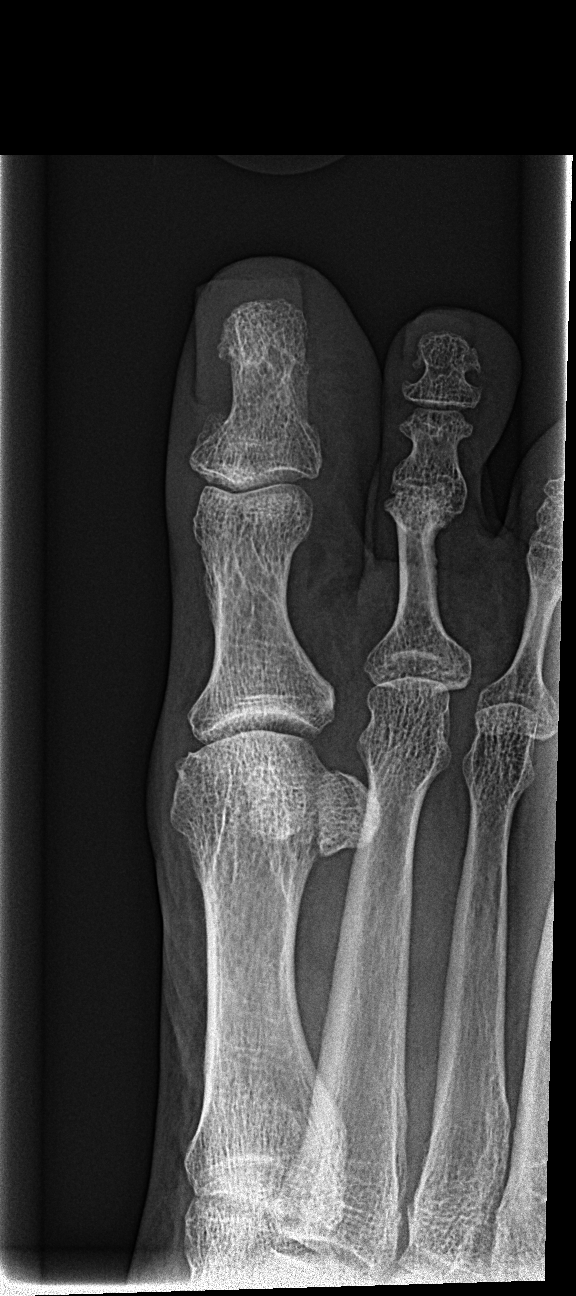

[toe lat]
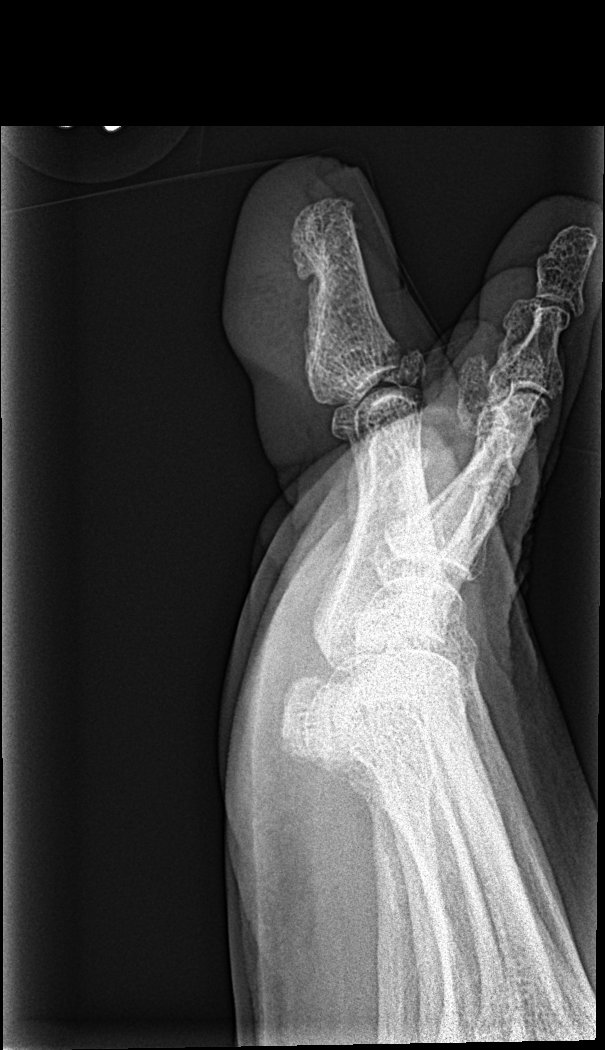

[3 of 3 positions shown; findings below may reference images not displayed]

FINDINGS: Frontal, oblique, and lateral views were obtained. There is a
fracture along the dorsal aspect of the proximal portion of the
first distal phalanx. Fracture fragment is present in the dorsal
aspect of the first IP joint. No other fracture is evident. No
dislocation. There is no appreciable joint space narrowing or
erosion.
IMPRESSION: Fracture along the dorsal aspect of the proximal portion of the
first distal phalanx. No other fracture. No dislocation. No
appreciable arthropathy.

These results will be called to the ordering clinician or
representative by the Radiologist Assistant, and communication
documented in the PACS or zVision Dashboard.
